# Patient Record
Sex: Male | Born: 1960
Health system: Southern US, Community
[De-identification: ages and names within clinical notes are randomized; demographics above are authoritative.]

## PROBLEM LIST (undated history)

## (undated) DIAGNOSIS — J45909 Unspecified asthma, uncomplicated: Secondary | ICD-10-CM

## (undated) DIAGNOSIS — I10 Essential (primary) hypertension: Secondary | ICD-10-CM

---

## 2009-07-20 ENCOUNTER — Ambulatory Visit: Payer: Self-pay | Admitting: Family Medicine

## 2009-07-20 DIAGNOSIS — J45909 Unspecified asthma, uncomplicated: Secondary | ICD-10-CM | POA: Insufficient documentation

## 2009-07-20 DIAGNOSIS — B356 Tinea cruris: Secondary | ICD-10-CM

## 2010-08-14 NOTE — Assessment & Plan Note (Signed)
Summary: COUGH & STD CHECK/KH   Vital Signs:  Patient Profile:   50 Years Old Male CC:      Diarrhea, body aches, fever, tired, dry cough x 1 week/ Trichomonas exposure Height:     67.75 inches Weight:      196 pounds O2 Sat:      98 % O2 treatment:    Room Air Temp:     98.2 degrees F oral Pulse rate:   62 / minute Pulse rhythm:   regular Resp:     12 per minute BP sitting:   151 / 97  (right arm) Cuff size:   regular  Vitals Entered By: Emilio Math (July 20, 2009 11:05 AM)                  Current Allergies: ! * PEANUTSHistory of Present Illness Chief Complaint: Diarrhea, body aches, fever, tired, dry cough x 1 week/ Trichomonas exposure History of Present Illness: AS ABOVE. DIARRHEA HAS RESOLVED. NO VOMITING. FEVER HAS BEEN LOW GRADE AT MOST. NO SELF TREATMENT. NO RUNNY NOSE. DENIES DISCHARGE. NO DYSURIA.  Current Meds ALBUTEROL SULFATE 0.63 MG/3ML NEBU (ALBUTEROL SULFATE)  DAYQUIL MULTI-SYMPTOM 30-325-10 MG/15ML LIQD (PSEUDOEPHEDRINE-APAP-DM)  METRONIDAZOLE 500 MG TABS (METRONIDAZOLE) 1 by mouth BID  REVIEW OF SYSTEMS Constitutional Symptoms       Complains of fever and chills.     Denies night sweats, weight loss, weight gain, and fatigue.  Eyes       Denies change in vision, eye pain, eye discharge, glasses, contact lenses, and eye surgery. Ear/Nose/Throat/Mouth       Complains of hoarseness.      Denies hearing loss/aids, change in hearing, ear pain, ear discharge, dizziness, frequent runny nose, frequent nose bleeds, sinus problems, sore throat, and tooth pain or bleeding.  Respiratory       Complains of dry cough.      Denies productive cough, wheezing, shortness of breath, asthma, bronchitis, and emphysema/COPD.  Cardiovascular       Denies murmurs, chest pain, and tires easily with exhertion.    Gastrointestinal       Complains of diarrhea.      Denies stomach pain, nausea/vomiting, constipation, blood in bowel movements, and  indigestion. Genitourniary       Denies painful urination, kidney stones, and loss of urinary control. Neurological       Denies paralysis, seizures, and fainting/blackouts. Musculoskeletal       Complains of muscle pain and joint pain.      Denies joint stiffness, decreased range of motion, redness, swelling, muscle weakness, and gout.  Skin       Denies bruising, unusual mles/lumps or sores, and hair/skin or nail changes.  Psych       Denies mood changes, temper/anger issues, anxiety/stress, speech problems, depression, and sleep problems.  Past History:  Past Medical History: Asthma  Past Surgical History: Inguinal herniorrhaphy Circumcission 1982  Family History: Mother, Diabetes Father, Prostate CA, Diabetes  Social History: Non smoker No ETOH No DRugs UPS Physical Exam General appearance: well developed, well nourished, no acute distress Nasal: mucosa pink, nonedematous, no septal deviation, turbinates normal Oral/Pharynx: tongue normal, posterior pharynx without erythema or exudate Neck: neck supple,  trachea midline, no masses Chest/Lungs: no rales, wheezes, or rhonchi bilateral, breath sounds equal without effort Heart: regular rate and  rhythm, no murmur Abdomen: soft, non-tender without obvious organomegaly GU:  NORMAL . NO DISCHARGE. RASH CONSISTANT WITH TINEA Assessment New Problems: UPPER RESPIRATORY INFECTION, ACUTE (  ICD-465.9) TINEA CRURIS (ICD-110.3) SEXUALLY TRANSMITTED DISEASE, EXPOSURE TO (ICD-V01.6) ASTHMA (ICD-493.90)   Plan New Medications/Changes: METRONIDAZOLE 500 MG TABS (METRONIDAZOLE) 1 by mouth BID  ##14 x 0, 07/20/2009, Marvis Moeller DO  New Orders: New Patient Level IV [99204]   Prescriptions: METRONIDAZOLE 500 MG TABS (METRONIDAZOLE) 1 by mouth BID  ##14 x 0   Entered and Authorized by:   Marvis Moeller DO   Signed by:   Marvis Moeller DO on 07/20/2009   Method used:   Print then Give to Patient   RxID:    [id number]   Patient Instructions: 1)  TYLENOL OR MOTRIN AS NEEDED. AVOID CAFFEINE AND MILK PRODUCTS. MUCINEX RECOMMENDED. LOTRIM AF SPRAY POWDER RECOMMENDED AS DIRECTED FOR ITCHING. KEEP AREA CLEAN AND DRY

## 2015-07-20 MED FILL — VENTOLIN HFA 90 MCG INHALER: 108 (90 BAS | 30 days supply | Qty: 18 | Fill #0

## 2015-07-20 MED FILL — FLOVENT HFA 220 MCG INHALER: 220 | 30 days supply | Qty: 12 | Fill #0

## 2015-08-14 MED FILL — LOSARTAN-HCTZ 50-12.5 MG TA: 50-12.5 | 30 days supply | Qty: 30 | Fill #3

## 2015-09-04 MED FILL — FLOVENT HFA 220 MCG INHALER: 220 | 30 days supply | Qty: 12 | Fill #1

## 2015-10-19 MED FILL — FLOVENT HFA 220 MCG INHALER: 220 | 30 days supply | Qty: 12 | Fill #2

## 2015-11-14 MED FILL — IBUPROFEN 800 MG TABLET: 800 | 7 days supply | Qty: 20 | Fill #0

## 2015-11-27 MED FILL — LOSARTAN-HCTZ 50-12.5 MG TA: 50-12.5 | 30 days supply | Qty: 30 | Fill #0

## 2015-11-27 MED FILL — FLOVENT HFA 220 MCG INHALER: 220 | 30 days supply | Qty: 12 | Fill #3

## 2016-01-08 MED FILL — LOSARTAN-HCTZ 50-12.5 MG TA: 50-12.5 | 30 days supply | Qty: 30 | Fill #1

## 2016-01-30 MED FILL — FLOVENT HFA 220 MCG INHALER: 220 | 30 days supply | Qty: 12 | Fill #0

## 2016-01-30 MED FILL — LOSARTAN POTASSIUM 100 MG T: 100 | 30 days supply | Qty: 30 | Fill #0

## 2016-02-27 MED FILL — AZELASTINE 0.1% (137 MCG) S: 0.1 | 30 days supply | Qty: 30 | Fill #0

## 2016-03-04 MED FILL — LOSARTAN-HCTZ 50-12.5 MG TA: 50-12.5 | 30 days supply | Qty: 30 | Fill #2

## 2016-03-26 MED FILL — FLOVENT HFA 220 MCG INHALER: 220 | 30 days supply | Qty: 12 | Fill #1

## 2016-04-01 MED FILL — LOSARTAN POTASSIUM 100 MG T: 100 | 30 days supply | Qty: 30 | Fill #1

## 2016-05-07 MED FILL — LOSARTAN POTASSIUM 100 MG T: 100 | 30 days supply | Qty: 30 | Fill #2

## 2016-05-29 MED FILL — VENTOLIN HFA 90 MCG INHALER: 108 (90 BAS | 30 days supply | Qty: 18 | Fill #1

## 2016-05-31 MED FILL — FLOVENT HFA 220 MCG INHALER: 220 | 30 days supply | Qty: 12 | Fill #2

## 2016-06-10 MED FILL — LOSARTAN POTASSIUM 100 MG T: 100 | 30 days supply | Qty: 30 | Fill #3

## 2016-07-26 MED FILL — FLOVENT HFA 220 MCG INHALER: 220 | 30 days supply | Qty: 12 | Fill #3

## 2016-09-19 MED FILL — FLOVENT HFA 220 MCG INHALER: 220 | 30 days supply | Qty: 12 | Fill #4

## 2017-04-14 MED FILL — SUPREP BOWEL PREP KIT: 17.5-3.13-1 | 1 days supply | Qty: 354 | Fill #0

## 2017-07-30 MED FILL — PROAIR HFA 90 MCG INHALER: 108 (90 BAS | 17 days supply | Qty: 9 | Fill #0

## 2018-03-22 ENCOUNTER — Emergency Department (INDEPENDENT_AMBULATORY_CARE_PROVIDER_SITE_OTHER)
Admission: EM | Admit: 2018-03-22 | Discharge: 2018-03-22 | Disposition: A | Discharge status: Home / Self Care | Payer: Commercial Managed Care - PPO | Source: Home / Self Care | Attending: Family Medicine | Admitting: Family Medicine

## 2018-03-22 ENCOUNTER — Encounter: Payer: Self-pay | Admitting: Emergency Medicine

## 2018-03-22 ENCOUNTER — Other Ambulatory Visit: Payer: Self-pay

## 2018-03-22 ENCOUNTER — Emergency Department (INDEPENDENT_AMBULATORY_CARE_PROVIDER_SITE_OTHER): Payer: Commercial Managed Care - PPO

## 2018-03-22 DIAGNOSIS — S63612A Unspecified sprain of right middle finger, initial encounter: Secondary | ICD-10-CM | POA: Diagnosis not present

## 2018-03-22 DIAGNOSIS — M7989 Other specified soft tissue disorders: Secondary | ICD-10-CM | POA: Diagnosis not present

## 2018-03-22 HISTORY — DX: Unspecified asthma, uncomplicated: J45.909

## 2018-03-22 HISTORY — DX: Essential (primary) hypertension: I10

## 2018-03-22 NOTE — ED Triage Notes (Signed)
57 y.o male presents c/o right 3rd digit pain after bowling last night. States he hooked the ball and felt a pop. He is taking Ibuprofen and icing with no relief.

## 2018-03-22 NOTE — ED Provider Notes (Signed)
[facility]    CSN: [id number] Arrival date & time: 03/22/18  1117     History   Chief Complaint Chief Complaint  Patient presents with  . Hand Pain    Right 3rd digit    HPI Kevin Gates is a 57 y.o. male.   HPI  Kevin Gates is a 57 y.o. male presenting to UC with c/o Right middle finger pain that occurred last night after bowling last night. Pt states he "hooked" the ball and felt a pop. He noticed swelling, bruising, and mild soreness. He has taken ibuprofen and applied ice w/o relief. Pt is leaving for a golfing trip next week and hopes to be better soon. He is Right hand dominant.    Past Medical History:  Diagnosis Date  . Asthma   . Hypertension     Patient Active Problem List   Diagnosis Date Noted  . TINEA CRURIS 07/20/2009  . ASTHMA 07/20/2009    History reviewed. No pertinent surgical history.     Home Medications    Prior to Admission medications   Medication Sig Start Date End Date Taking? Authorizing Provider  albuterol (VENTOLIN HFA) 108 (90 Base) MCG/ACT inhaler Inhale into the lungs. 10/04/16  Yes [provider]  amLODipine (NORVASC) 5 MG tablet Take by mouth. 10/29/17  Yes [provider]  losartan (COZAAR) 100 MG tablet Take by mouth. 10/29/17  Yes [provider]  aspirin 81 MG chewable tablet Chew by mouth.    [provider]  Cholecalciferol (VITAMIN D3) 5000 units CAPS Take by mouth.    [provider]    Family History Family History  Problem Relation Age of Onset  . Hypertension Mother   . Diabetes Father     Social History Social History   Tobacco Use  . Smoking status: Never Smoker  . Smokeless tobacco: Never Used  Substance Use Topics  . Alcohol use: Not Currently  . Drug use: Not Currently     Allergies   Peanut-containing drug products   Review of Systems Review of Systems  Musculoskeletal: Positive for arthralgias, joint swelling and myalgias.    Skin: Positive for color change. Negative for wound.  Neurological: Negative for numbness.     Physical Exam Triage Vital Signs ED Triage Vitals  Enc Vitals Group     BP 03/22/18 1135 (!) 151/91     Pulse Rate 03/22/18 1135 61     Resp --      Temp 03/22/18 1135 98.8 F (37.1 C)     Temp Source 03/22/18 1135 Oral     SpO2 03/22/18 1135 99 %     Weight 03/22/18 1137 193 lb (87.5 kg)     Height 03/22/18 1137 5\' 7"  (1.702 m)     Head Circumference --      Peak Flow --      Pain Score 03/22/18 1136 3     Pain Loc --      Pain Edu? --      Excl. in GC? --    No data found.  Updated Vital Signs BP (!) 151/91   Pulse 61   Temp 98.8 F (37.1 C) (Oral)   Ht 5\' 7"  (1.702 m)   Wt 193 lb (87.5 kg)   SpO2 99%   BMI 30.23 kg/m   Visual Acuity Right Eye Distance:   Left Eye Distance:   Bilateral Distance:    Right Eye Near:   Left Eye Near:  Bilateral Near:     Physical Exam  Constitutional: He is oriented to person, place, and time. He appears well-developed and well-nourished.  HENT:  Head: Normocephalic and atraumatic.  Eyes: EOM are normal.  Neck: Normal range of motion.  Cardiovascular: Normal rate.  Pulmonary/Chest: Effort normal.  Musculoskeletal: He exhibits edema and tenderness.  Right middle finger: mild to moderate edema. Tenderness to proximal and middle phalanx. Slight decreased flexion due to pain and swelling. Full extension.   Neurological: He is alert and oriented to person, place, and time.  Skin: Skin is warm and dry. Capillary refill takes less than 2 seconds.  Right middle finger: skin in tact. Faint ecchymosis to proximal and middle phalanx.   Psychiatric: He has a normal mood and affect. His behavior is normal.  Nursing note and vitals reviewed.    UC Treatments / Results  Labs (all labs ordered are listed, but only abnormal results are displayed) Labs Reviewed - No data to display  EKG None  Radiology Dg Finger Middle  Right  Result Date: 03/22/2018 CLINICAL DATA:  Pain and swelling of the right third finger post bowling. EXAM: RIGHT MIDDLE FINGER 2+V COMPARISON:  None. FINDINGS: There is no evidence of fracture or dislocation. There is no evidence of arthropathy or other focal bone abnormality. Soft tissue swelling of the third right finger. IMPRESSION: Soft tissue swelling without evidence of osseous abnormality of the third right finger. Electronically Signed   By: Ted Mcalpine M.D.   On: 03/22/2018 12:13    Procedures Procedures (including critical care time)  Medications Ordered in UC Medications - No data to display  Initial Impression / Assessment and Plan / UC Course  I have reviewed the triage vital signs and the nursing notes.  Pertinent labs & imaging results that were available during my care of the patient were reviewed by me and considered in my medical decision making (see chart for details).     Discussed imaging with pt Will tx as finger sprain Finger splint provided for comfort  Final Clinical Impressions(s) / UC Diagnoses   Final diagnoses:  Sprain of right middle finger, initial encounter     Discharge Instructions      You may take 500mg  acetaminophen every 4-6 hours or in combination with ibuprofen 400-600mg  every 6-8 hours as needed for pain and inflammation.      ED Prescriptions    None     Controlled Substance Prescriptions [place] Controlled Substance Registry consulted? Not Applicable   Rolla Plate 03/22/18 1430

## 2018-03-22 NOTE — Discharge Instructions (Signed)
  You may take 500mg acetaminophen every 4-6 hours or in combination with ibuprofen 400-600mg every 6-8 hours as needed for pain and inflammation.  

## 2018-05-12 MED FILL — AZELASTINE HCL 137 MCG/SPRA: 137 | 25 days supply | Qty: 30 | Fill #0

## 2018-09-20 ENCOUNTER — Other Ambulatory Visit: Payer: Self-pay

## 2018-09-20 ENCOUNTER — Emergency Department (INDEPENDENT_AMBULATORY_CARE_PROVIDER_SITE_OTHER)
Admission: EM | Admit: 2018-09-20 | Discharge: 2018-09-20 | Disposition: A | Discharge status: Home / Self Care | Payer: Commercial Managed Care - PPO | Source: Home / Self Care

## 2018-09-20 ENCOUNTER — Encounter: Payer: Self-pay | Admitting: Emergency Medicine

## 2018-09-20 DIAGNOSIS — B351 Tinea unguium: Secondary | ICD-10-CM

## 2018-09-20 DIAGNOSIS — L03032 Cellulitis of left toe: Secondary | ICD-10-CM

## 2018-09-20 MED ORDER — CEPHALEXIN 500 MG PO CAPS: 500.0000 mg | ORAL_CAPSULE | Freq: Three times a day (TID) | ORAL | 0 refills | Status: AC

## 2018-09-20 MED ORDER — MUPIROCIN 2 % EX OINT: TOPICAL_OINTMENT | CUTANEOUS | 0 refills | Status: AC

## 2018-09-20 MED ORDER — TERBINAFINE HCL 250 MG PO TABS: 250.0000 mg | ORAL_TABLET | Freq: Every day | ORAL | 0 refills | Status: AC

## 2018-09-20 NOTE — ED Triage Notes (Signed)
Here with left great toe nail problem x1 week. Nail is almost off with swelling and slight drainage in sock.

## 2018-09-20 NOTE — ED Provider Notes (Signed)
[facility]    CSN: [id number] Arrival date & time: 09/20/18  1201     History   Chief Complaint Chief Complaint  Patient presents with  . Nail Problem    HPI Viliami Gialanella is a 58 y.o. male.   HPI Matthue Rufty is a 58 y.o. male presenting to UC with c/o Left great toenail thickening and with redness, swelling and drainage from his toe for a few days. He noticed his sock was sticking to his toe today. He has not tried anything for his symptoms. Denies pain or fever. No hx of diabetes. Last physical was 84mo ago.   Past Medical History:  Diagnosis Date  . Asthma   . Hypertension     Patient Active Problem List   Diagnosis Date Noted  . TINEA CRURIS 07/20/2009  . ASTHMA 07/20/2009    History reviewed. No pertinent surgical history.     Home Medications    Prior to Admission medications   Medication Sig Start Date End Date Taking? Authorizing Provider  albuterol (VENTOLIN HFA) 108 (90 Base) MCG/ACT inhaler Inhale into the lungs. 10/04/16   [provider]  amLODipine (NORVASC) 5 MG tablet Take by mouth. 10/29/17   [provider]  aspirin 81 MG chewable tablet Chew by mouth.    [provider]  cephALEXin (KEFLEX) 500 MG capsule Take 1 capsule (500 mg total) by mouth 3 (three) times daily. 09/20/18   Lurene Shadow, PA-C  Cholecalciferol (VITAMIN D3) 5000 units CAPS Take by mouth.    [provider]  losartan (COZAAR) 100 MG tablet Take by mouth. 10/29/17   [provider]  mupirocin ointment (BACTROBAN) 2 % Apply to wound 3 times daily for 5 days 09/20/18   Lurene Shadow, PA-C  terbinafine (LAMISIL) 250 MG tablet Take 1 tablet (250 mg total) by mouth daily. 09/20/18   Lurene Shadow, PA-C    Family History Family History  Problem Relation Age of Onset  . Hypertension Mother   . Diabetes Father     Social History Social History   Tobacco Use  . Smoking status: Never Smoker  . Smokeless tobacco: Never  Used  Substance Use Topics  . Alcohol use: Not Currently  . Drug use: Not Currently     Allergies   Peanut-containing drug products   Review of Systems Review of Systems  Musculoskeletal: Positive for joint swelling. Negative for arthralgias and myalgias.  Skin: Positive for color change and wound.  Neurological: Negative for weakness and numbness.     Physical Exam Triage Vital Signs ED Triage Vitals [09/20/18 1231]  Enc Vitals Group     BP 138/80     Pulse Rate 64     Resp      Temp 98.5 F (36.9 C)     Temp Source Oral     SpO2 98 %     Weight 193 lb (87.5 kg)     Height 5\' 8"  (1.727 m)     Head Circumference      Peak Flow      Pain Score 0     Pain Loc      Pain Edu?      Excl. in GC?    No data found.  Updated Vital Signs BP 138/80 (BP Location: Right Arm)   Pulse 64   Temp 98.5 F (36.9 C) (Oral)   Ht 5\' 8"  (1.727 m)   Wt 193 lb (87.5 kg)   SpO2 98%  BMI 29.35 kg/m   Visual Acuity Right Eye Distance:   Left Eye Distance:   Bilateral Distance:    Right Eye Near:   Left Eye Near:    Bilateral Near:     Physical Exam Vitals signs and nursing note reviewed.  Constitutional:      Appearance: He is well-developed.  HENT:     Head: Normocephalic and atraumatic.  Neck:     Musculoskeletal: Normal range of motion.  Cardiovascular:     Rate and Rhythm: Normal rate.  Pulmonary:     Effort: Pulmonary effort is normal.  Musculoskeletal: Normal range of motion.        General: Swelling present. No tenderness.     Comments: Left great toe: mild edema, full ROM  Skin:    General: Skin is warm and dry.     Capillary Refill: Capillary refill takes less than 2 seconds.     Findings: Erythema present.     Comments: Left foot: thick yellowed nails. Great toe: erythema at distal aspect, scant yellow oozing discharge from under the nail. Non-tender.  Neurological:     Mental Status: He is alert and oriented to person, place, and time.  Psychiatric:         Behavior: Behavior normal.      UC Treatments / Results  Labs (all labs ordered are listed, but only abnormal results are displayed) Labs Reviewed - No data to display  EKG None  Radiology No results found.  Procedures Procedures (including critical care time)  Medications Ordered in UC Medications - No data to display  Initial Impression / Assessment and Plan / UC Course  I have reviewed the triage vital signs and the nursing notes.  Pertinent labs & imaging results that were available during my care of the patient were reviewed by me and considered in my medical decision making (see chart for details).     Hx and exam c/w fungal and bacterial infection of Left great toe Encouraged f/u with Podiatrist AVS provided  Final Clinical Impressions(s) / UC Diagnoses   Final diagnoses:  Onychomycosis  Cellulitis of great toe, left     Discharge Instructions      Please schedule a follow up exam in 1-2 weeks with your family medicine provider or a podiatrist for recheck of symptoms and to discuss your medication for the fungal infection as it may take several weeks/months to completely cure the nail fungal infection.     ED Prescriptions    Medication Sig Dispense Auth. Provider   mupirocin ointment (BACTROBAN) 2 % Apply to wound 3 times daily for 5 days 22 g Maguire Killmer O, PA-C   cephALEXin (KEFLEX) 500 MG capsule Take 1 capsule (500 mg total) by mouth 3 (three) times daily. 21 capsule Doroteo Glassman, Jori Thrall O, PA-C   terbinafine (LAMISIL) 250 MG tablet Take 1 tablet (250 mg total) by mouth daily. 30 tablet Lurene Shadow, PA-C     Controlled Substance Prescriptions [place] Controlled Substance Registry consulted? Not Applicable   Rolla Plate 09/20/18 1348

## 2018-09-20 NOTE — Discharge Instructions (Signed)
°  Please schedule a follow up exam in 1-2 weeks with your family medicine provider or a podiatrist for recheck of symptoms and to discuss your medication for the fungal infection as it may take several weeks/months to completely cure the nail fungal infection.

## 2019-02-01 MED FILL — ATORVASTATIN 20 MG TABLET: 20 | 30 days supply | Qty: 30 | Fill #0

## 2019-03-23 MED FILL — ATORVASTATIN 20 MG TABLET: 20 | 30 days supply | Qty: 30 | Fill #0

## 2019-04-27 MED FILL — ATORVASTATIN 20 MG TABLET: 20 | 30 days supply | Qty: 30 | Fill #1

## 2019-05-31 MED FILL — ATORVASTATIN 20 MG TABLET: 20 | 30 days supply | Qty: 30 | Fill #2

## 2019-06-29 MED FILL — ATORVASTATIN 20 MG TABLET: 20 | 30 days supply | Qty: 30 | Fill #3

## 2019-08-02 MED FILL — ATORVASTATIN 20 MG TABLET: 20 | 30 days supply | Qty: 30 | Fill #4

## 2020-02-03 IMAGING — DX DG FINGER MIDDLE 2+V*R*
3 series · 3 of 3 positions shown · non-contrast
Comparison: None.

CLINICAL DATA: Pain and swelling of the right third finger post
bowling.

EXAM:
RIGHT MIDDLE FINGER 2+V

[finger ap]
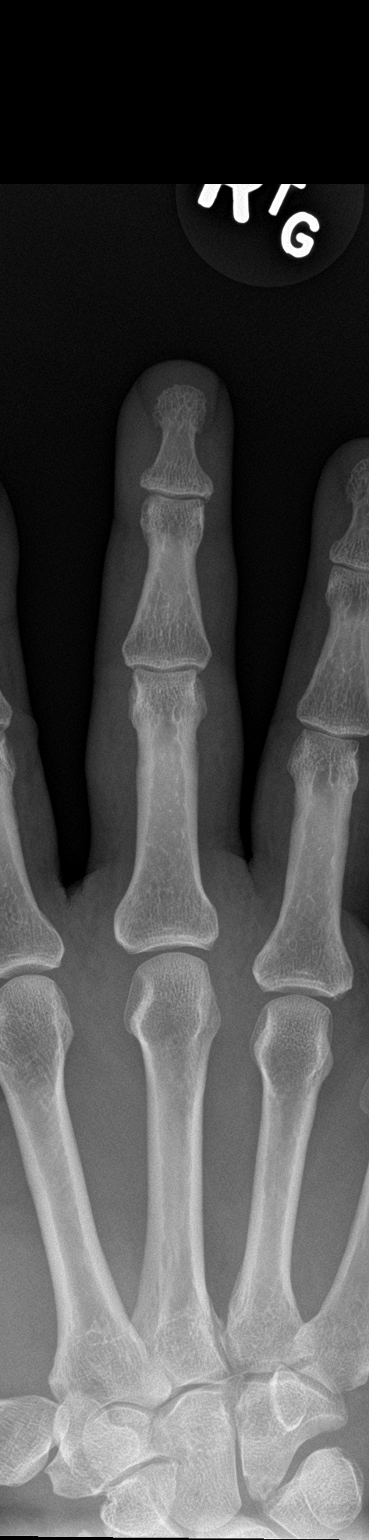

[finger obl]
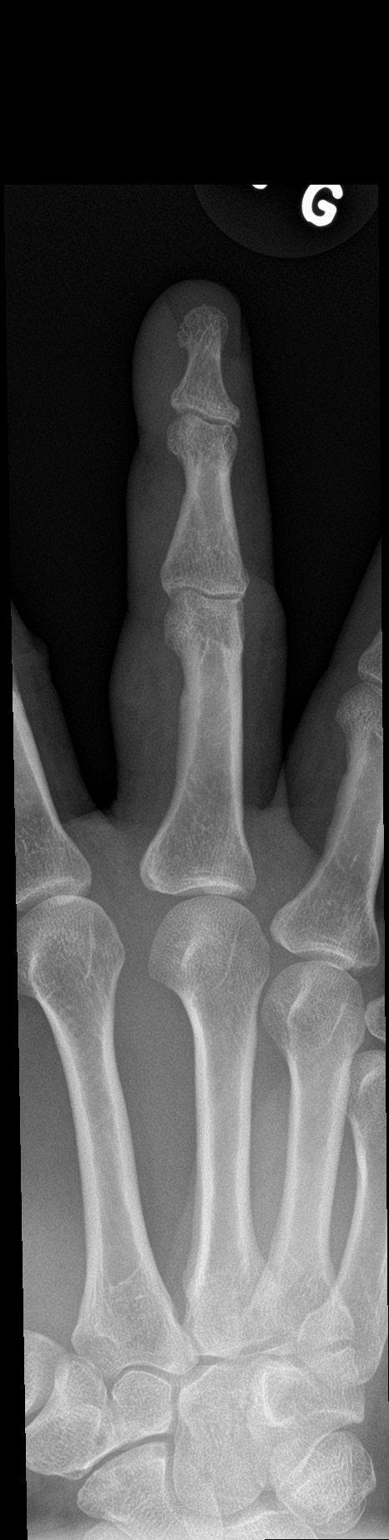

[finger lat]
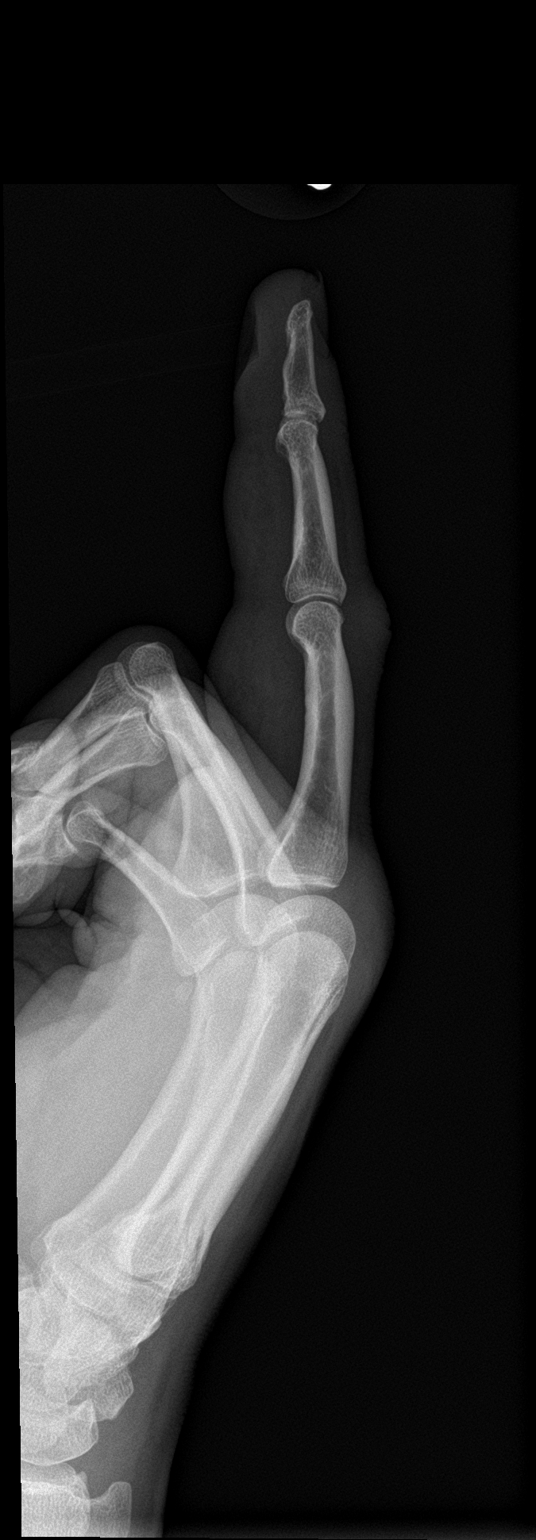

[3 of 3 positions shown; findings below may reference images not displayed]

FINDINGS: There is no evidence of fracture or dislocation. There is no
evidence of arthropathy or other focal bone abnormality. Soft tissue
swelling of the third right finger.
IMPRESSION: Soft tissue swelling without evidence of osseous abnormality of the
third right finger.

## 2021-06-25 ENCOUNTER — Emergency Department (INDEPENDENT_AMBULATORY_CARE_PROVIDER_SITE_OTHER)
Admission: EM | Admit: 2021-06-25 | Discharge: 2021-06-25 | Disposition: A | Discharge status: Home / Self Care | Payer: Managed Care, Other (non HMO) | Source: Home / Self Care

## 2021-06-25 DIAGNOSIS — J029 Acute pharyngitis, unspecified: Secondary | ICD-10-CM | POA: Diagnosis not present

## 2021-06-25 LAB — POCT RAPID STREP A (OFFICE): Rapid Strep A Screen: NEGATIVE

## 2021-06-25 MED ORDER — AZITHROMYCIN 250 MG PO TABS: 250.0000 mg | ORAL_TABLET | Freq: Every day | ORAL | 0 refills | Status: AC

## 2021-06-25 NOTE — ED Triage Notes (Signed)
Pt presents with sore throat the began Saturday, pt states he also has congestion. Negative home covid test. Covid positive two weeks ago.

## 2021-06-25 NOTE — ED Provider Notes (Signed)
[facility]    CSN: [id number] Arrival date & time: 06/25/21  0857      History   Chief Complaint Chief Complaint  Patient presents with   Sore Throat    HPI Lorene Samaan is a 60 y.o. male.   HPI 61 year old male presents with sore throat for 2 days.  Patient reports having COVID-19 2 weeks ago.  Past Medical History:  Diagnosis Date   Asthma    Hypertension     Patient Active Problem List   Diagnosis Date Noted   TINEA CRURIS 07/20/2009   ASTHMA 07/20/2009    History reviewed. No pertinent surgical history.     Home Medications    Prior to Admission medications   Medication Sig Start Date End Date Taking? Authorizing Provider  azithromycin (ZITHROMAX) 250 MG tablet Take 1 tablet (250 mg total) by mouth daily. Take first 2 tablets together, then 1 every day until finished. 06/25/21  Yes Trevor Iha, FNP  fluticasone (FLOVENT HFA) 220 MCG/ACT inhaler Inhale into the lungs 2 (two) times daily.   Yes [provider]  Multiple Vitamins-Minerals (MENS MULTIVITAMIN PLUS PO) Take by mouth.   Yes [provider]  Zinc Sulfate (ZINC 15 PO) Take by mouth.   Yes [provider]  albuterol (VENTOLIN HFA) 108 (90 Base) MCG/ACT inhaler Inhale into the lungs. 10/04/16   [provider]  amLODipine (NORVASC) 5 MG tablet Take by mouth. 10/29/17   [provider]  aspirin 81 MG chewable tablet Chew by mouth. Patient not taking: Reported on 06/25/2021    [provider]  cephALEXin (KEFLEX) 500 MG capsule Take 1 capsule (500 mg total) by mouth 3 (three) times daily. Patient not taking: Reported on 06/25/2021 09/20/18   Lurene Shadow, PA-C  Cholecalciferol (VITAMIN D3) 5000 units CAPS Take by mouth.    [provider]  losartan (COZAAR) 100 MG tablet Take by mouth. 10/29/17   [provider]  mupirocin ointment (BACTROBAN) 2 % Apply to wound 3 times daily for 5 days Patient not taking: Reported  on 06/25/2021 09/20/18   Lurene Shadow, PA-C  terbinafine (LAMISIL) 250 MG tablet Take 1 tablet (250 mg total) by mouth daily. Patient not taking: Reported on 06/25/2021 09/20/18   Lurene Shadow, PA-C    Family History Family History  Problem Relation Age of Onset   Hypertension Mother    Diabetes Father     Social History Social History   Tobacco Use   Smoking status: Never   Smokeless tobacco: Never  Vaping Use   Vaping Use: Never used  Substance Use Topics   Alcohol use: Not Currently   Drug use: Not Currently     Allergies   Peanut-containing drug products   Review of Systems Review of Systems  HENT:  Positive for sore throat.   All other systems reviewed and are negative.   Physical Exam Triage Vital Signs ED Triage Vitals  Enc Vitals Group     BP 06/25/21 0924 (!) 146/94     Pulse Rate 06/25/21 0924 75     Resp 06/25/21 0924 14     Temp 06/25/21 0924 98.6 F (37 C)     Temp Source 06/25/21 0924 Oral     SpO2 06/25/21 0924 96 %     Weight --      Height --      Head Circumference --      Peak Flow --      Pain  Score 06/25/21 0926 0     Pain Loc --      Pain Edu? --      Excl. in GC? --    No data found.  Updated Vital Signs BP (!) 146/94 (BP Location: Left Arm)   Pulse 75   Temp 98.6 F (37 C) (Oral)   Resp 14   SpO2 96%      Physical Exam Vitals and nursing note reviewed.  Constitutional:      General: He is not in acute distress.    Appearance: He is well-developed. He is obese. He is not ill-appearing.  HENT:     Head: Normocephalic and atraumatic.     Right Ear: Tympanic membrane and ear canal normal.     Left Ear: Tympanic membrane and ear canal normal.     Mouth/Throat:     Mouth: Mucous membranes are moist.     Pharynx: Oropharynx is clear. Uvula midline. Posterior oropharyngeal erythema and uvula swelling present.  Eyes:     Conjunctiva/sclera: Conjunctivae normal.     Pupils: Pupils are equal, round, and reactive to light.   Cardiovascular:     Rate and Rhythm: Normal rate and regular rhythm.  Pulmonary:     Effort: Pulmonary effort is normal.     Breath sounds: Normal breath sounds.  Musculoskeletal:     Cervical back: Normal range of motion and neck supple.  Skin:    General: Skin is warm and dry.  Neurological:     General: No focal deficit present.     Mental Status: He is alert and oriented to person, place, and time.    MDM: 1.  Sore throat-Rx'd Zithromax. UC Treatments / Results  Labs (all labs ordered are listed, but only abnormal results are displayed) Labs Reviewed  CULTURE, GROUP A STREP  POCT RAPID STREP A (OFFICE)    EKG   Radiology No results found.  Procedures Procedures (including critical care time)  Medications Ordered in UC Medications - No data to display  Initial Impression / Assessment and Plan / UC Course  I have reviewed the triage vital signs and the nursing notes.  Pertinent labs & imaging results that were available during my care of the patient were reviewed by me and considered in my medical decision making (see chart for details).     MDM: 1.  Sore throat-Rx'd Zithromax.  Advised patient we will follow-up in 2 days with throat culture results.  Advised patient if starting medication take to completion with food.  Encouraged patient to increase daily water intake while taking this medication.  Work note provided prior to discharge.  Patient discharged home, hemodynamically stable. Final Clinical Impressions(s) / UC Diagnoses   Final diagnoses:  Sore throat     Discharge Instructions      Advised patient we will follow-up in 2 days with throat culture results.  Advised patient if starting medication take to completion with food.  Encouraged patient to increase daily water intake while taking this medication.     ED Prescriptions     Medication Sig Dispense Auth. Provider   azithromycin (ZITHROMAX) 250 MG tablet Take 1 tablet (250 mg total) by  mouth daily. Take first 2 tablets together, then 1 every day until finished. 6 tablet Trevor Iha, FNP      PDMP not reviewed this encounter.   Trevor Iha, FNP 06/25/21 1052

## 2021-06-25 NOTE — Discharge Instructions (Addendum)
Advised patient we will follow-up in 2 days with throat culture results.  Advised patient if starting medication take to completion with food.  Encouraged patient to increase daily water intake while taking this medication.

## 2021-06-28 LAB — CULTURE, GROUP A STREP: Strep A Culture: NEGATIVE

## 2021-06-29 ENCOUNTER — Telehealth: Payer: Self-pay | Admitting: Emergency Medicine

## 2021-06-29 NOTE — Telephone Encounter (Signed)
Call back to [facility] regarding throat culture results - negative. Pt started the z-pak today for hoarse voice & chest congestion. Pt is going to start coricidan Cold  medicine for congestion. No other questions at this time
